# Patient Record
Sex: Female | Born: 1985 | Race: White | Hispanic: No | Marital: Single
Health system: Southern US, Community
[De-identification: ages and names within clinical notes are randomized; demographics above are authoritative.]

---

## 2004-04-12 ENCOUNTER — Emergency Department: Payer: Self-pay | Admitting: Unknown Physician Specialty

## 2004-04-13 ENCOUNTER — Inpatient Hospital Stay: Payer: Self-pay | Admitting: Obstetrics and Gynecology

## 2005-08-08 ENCOUNTER — Emergency Department: Payer: Self-pay | Admitting: Emergency Medicine

## 2005-08-10 ENCOUNTER — Emergency Department: Payer: Self-pay | Admitting: Unknown Physician Specialty

## 2006-01-08 ENCOUNTER — Emergency Department: Payer: Self-pay | Admitting: Emergency Medicine

## 2006-01-09 ENCOUNTER — Ambulatory Visit: Payer: Self-pay | Admitting: Emergency Medicine

## 2006-09-30 ENCOUNTER — Emergency Department (HOSPITAL_COMMUNITY): Admission: EM | Admit: 2006-09-30 | Discharge: 2006-09-30 | Payer: Self-pay | Admitting: Emergency Medicine

## 2007-10-20 ENCOUNTER — Emergency Department: Payer: Self-pay | Admitting: Emergency Medicine

## 2007-12-06 ENCOUNTER — Emergency Department: Payer: Self-pay | Admitting: Emergency Medicine

## 2008-02-12 ENCOUNTER — Emergency Department: Payer: Self-pay | Admitting: Internal Medicine

## 2008-04-06 ENCOUNTER — Emergency Department: Payer: Self-pay | Admitting: Emergency Medicine

## 2008-05-30 ENCOUNTER — Emergency Department: Payer: Self-pay | Admitting: Emergency Medicine

## 2008-06-01 ENCOUNTER — Emergency Department: Payer: Self-pay | Admitting: Emergency Medicine

## 2008-07-04 ENCOUNTER — Emergency Department: Payer: Self-pay | Admitting: Emergency Medicine

## 2008-07-22 ENCOUNTER — Observation Stay: Payer: Self-pay | Admitting: Obstetrics and Gynecology

## 2008-09-05 ENCOUNTER — Observation Stay: Payer: Self-pay | Admitting: Obstetrics & Gynecology

## 2008-09-27 ENCOUNTER — Observation Stay: Payer: Self-pay | Admitting: Obstetrics and Gynecology

## 2008-10-29 ENCOUNTER — Observation Stay: Payer: Self-pay | Admitting: Unknown Physician Specialty

## 2008-11-14 ENCOUNTER — Observation Stay: Payer: Self-pay | Admitting: Obstetrics and Gynecology

## 2008-12-05 ENCOUNTER — Inpatient Hospital Stay: Payer: Self-pay | Admitting: Obstetrics and Gynecology

## 2009-03-25 ENCOUNTER — Emergency Department: Payer: Self-pay | Admitting: Emergency Medicine

## 2009-05-17 ENCOUNTER — Ambulatory Visit: Payer: Self-pay | Admitting: Family Medicine

## 2009-05-28 ENCOUNTER — Emergency Department: Payer: Self-pay | Admitting: Emergency Medicine

## 2009-05-30 ENCOUNTER — Ambulatory Visit: Payer: Self-pay | Admitting: Sports Medicine

## 2009-06-01 ENCOUNTER — Encounter: Payer: Self-pay | Admitting: Family Medicine

## 2009-06-09 ENCOUNTER — Encounter: Payer: Self-pay | Admitting: Family Medicine

## 2009-07-01 ENCOUNTER — Ambulatory Visit: Payer: Self-pay | Admitting: Family Medicine

## 2010-01-20 IMAGING — US US OB US >=[ID] SNGL FETUS
1 series · 17 of 28 positions shown · non-contrast
Comparison: none

REASON FOR EXAM: severe diffuse abd pain
COMMENTS:

[Series 1: us ob us >=(id) sngl fetus · 17 of 57 slices shown]
[im 1/57]
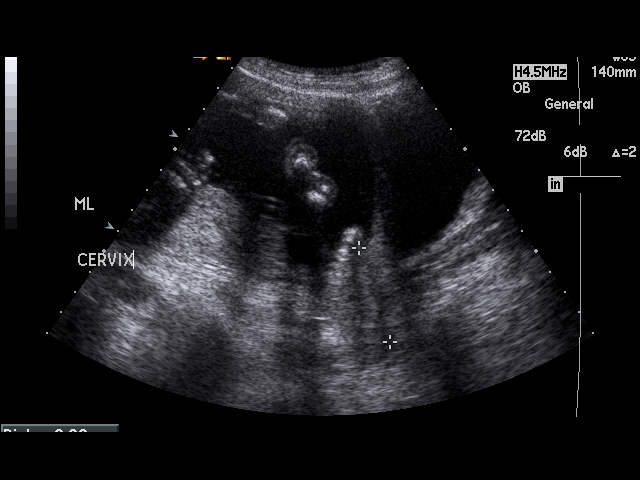
[im 5/57]
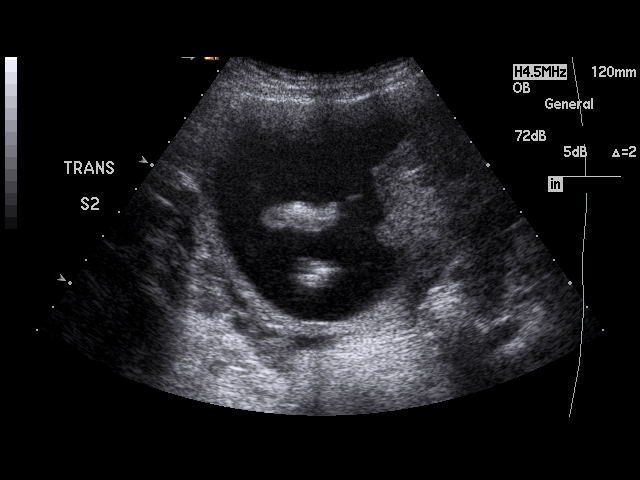
[im 9/57]
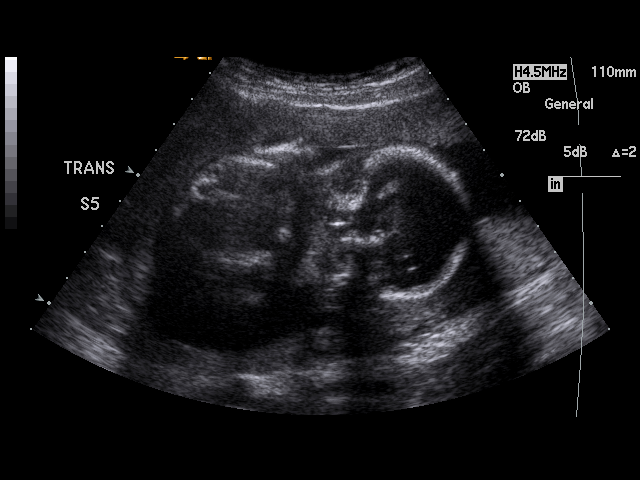
[im 11/57]
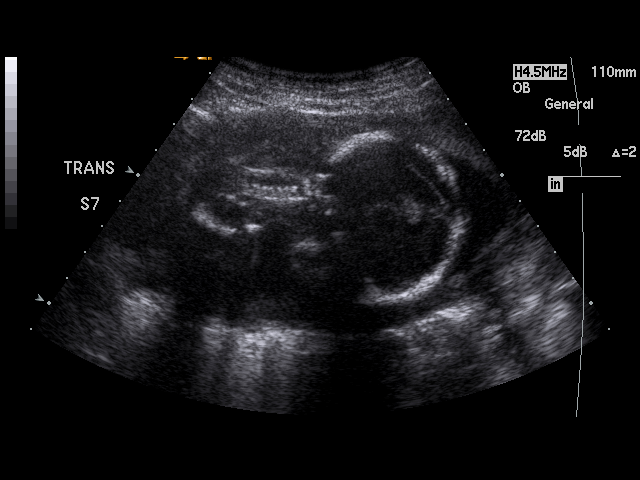
[im 15/57]
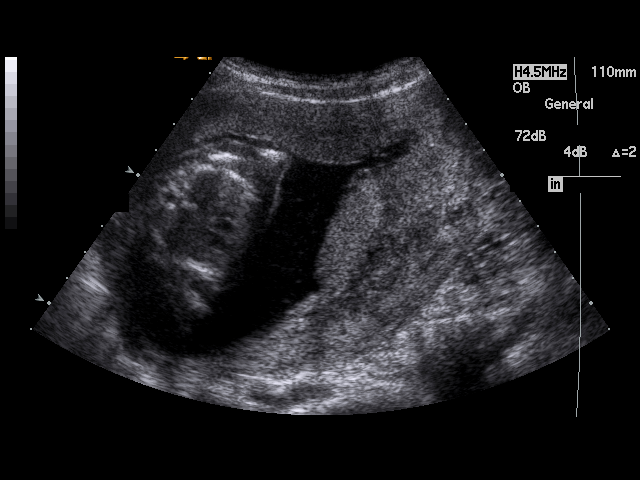
[im 19/57]
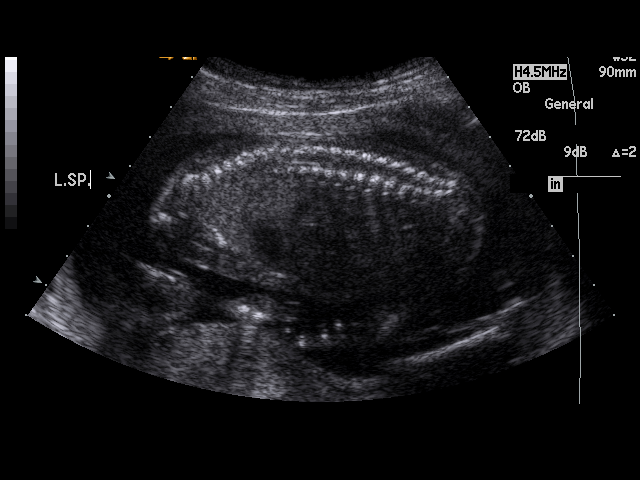
[im 21/57]
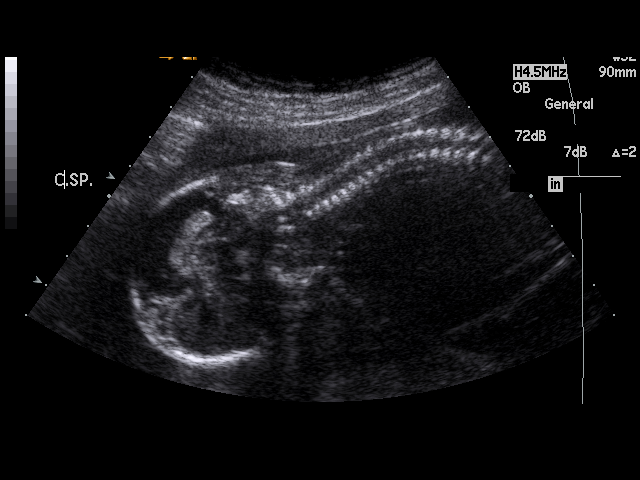
[im 25/57]
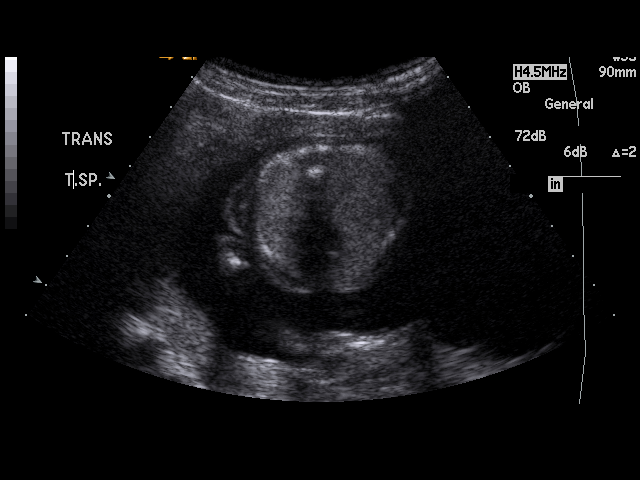
[im 30/57]
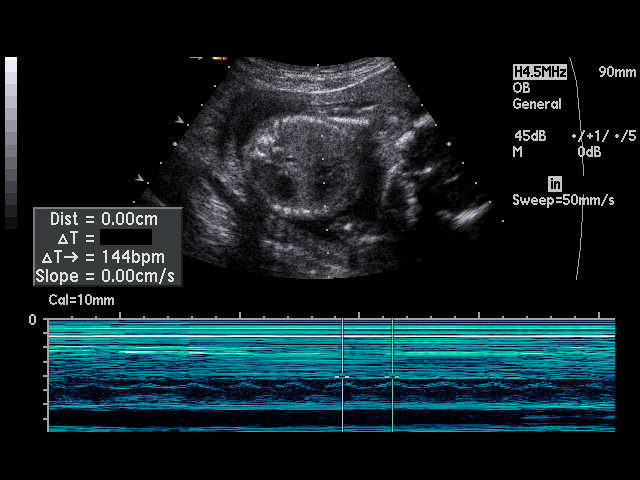
[im 32/57]
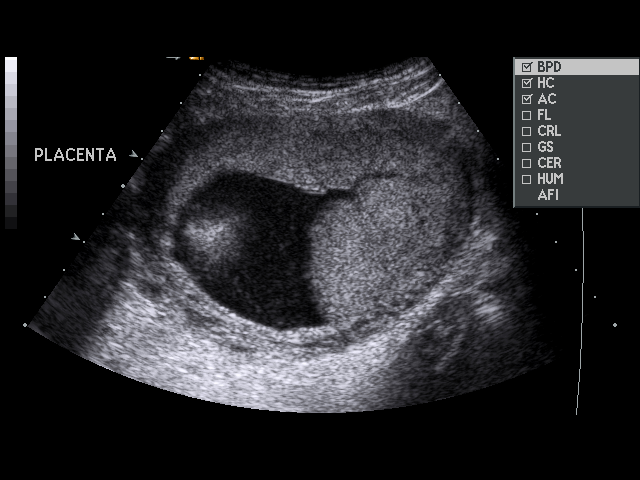
[im 36/57]
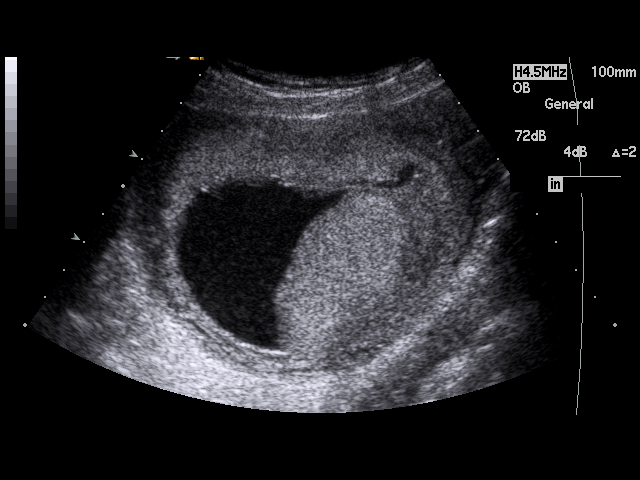
[im 38/57]
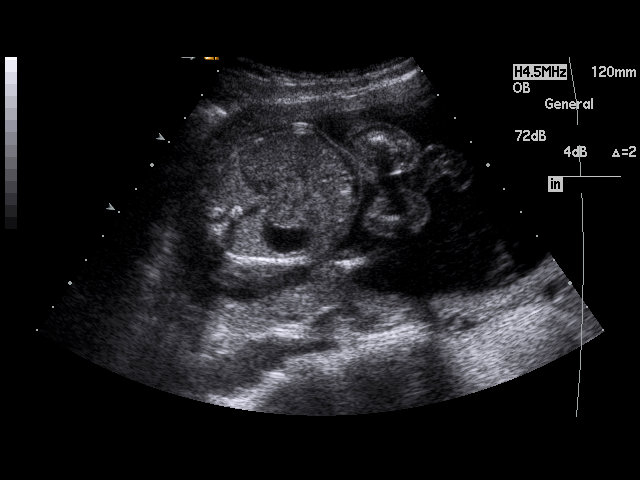
[im 42/57]
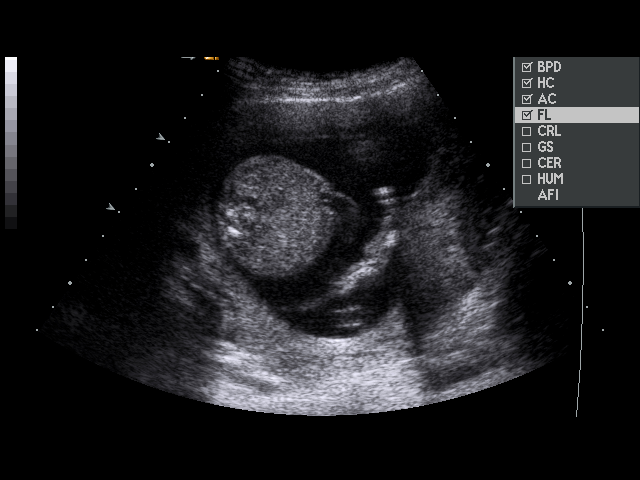
[im 46/57]
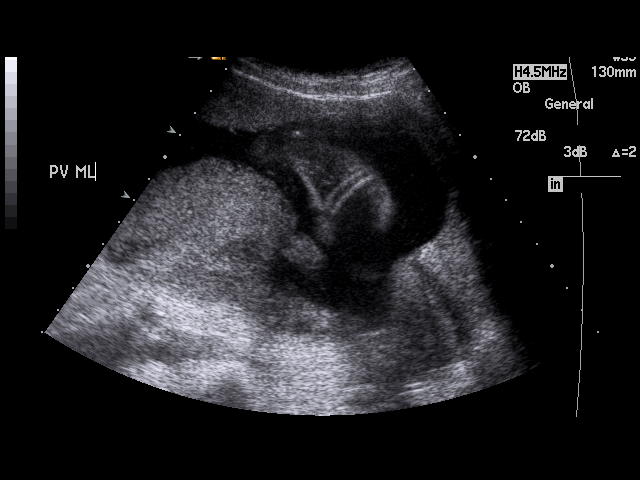
[im 48/57]
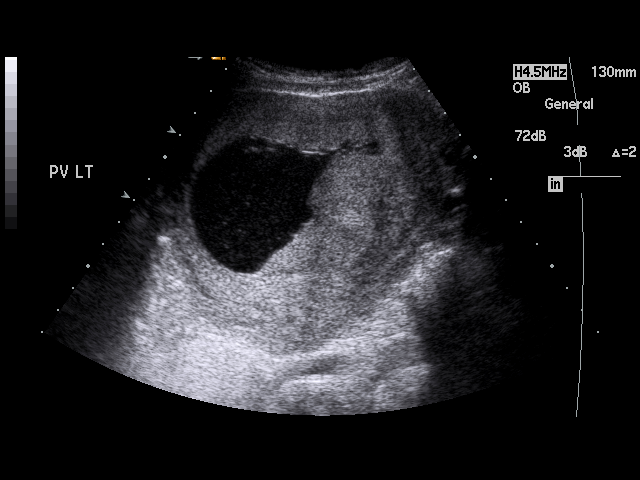
[im 52/57]
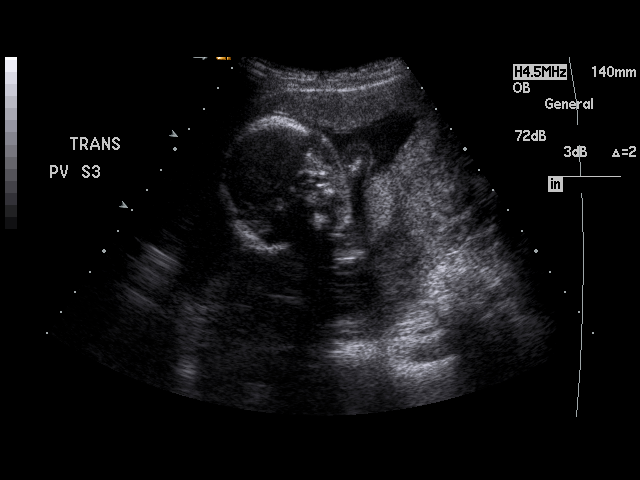
[im 57/57]
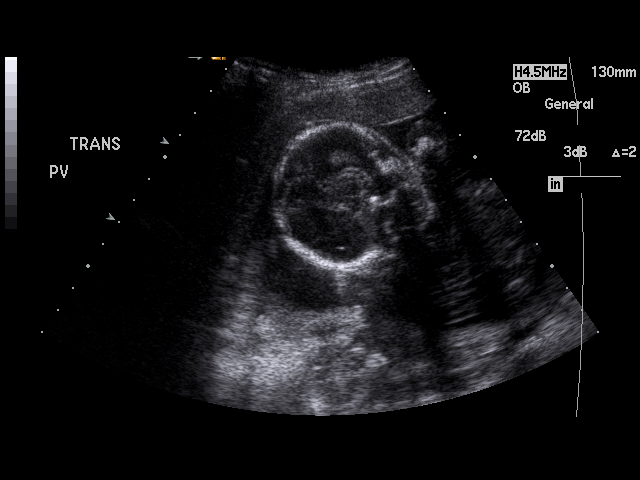

[17 of 28 positions shown; findings below may reference images not displayed]

PROCEDURE:     US  - US OB GREATER/OR EQUAL TO [id number]  - July 22, 2008  [DATE]

RESULT:     A single, viable, live intrauterine pregnancy is appreciated
with mean gestational age by ultrasound of 19 weeks, 4 days. Presentation is
breech. Estimated fetal heart rate is 124 beats per minute. Estimated date
of delivery is 12/11/2008. The cervical length is 4 cm. There is no evidence
of placental abruption. The placenta is anterior and left lateral. There is
no evidence of placenta previa.

Fetal biometry:

     BPD:  4.49 cm, corresponding to EGA of 19 weeks, 4 days
       HC: 16.98 cm, corresponding to EGA of 19 weeks, 4 days
       AC: 14.34 cm, corresponding to EGA of 19 weeks, 5 days
       FL:  13.51 cm, corresponding to EGA of 19 weeks, 6 days

Estimated fetal weight is 333 grams plus or minus 45 grams which corresponds
to 12 ounces, plus or minus 2 ounces.
IMPRESSION: 1.     Single, viable intrauterine pregnancy as described above.
2.     Dr. Buan of the Emergency Department was informed of these findings
via preliminary faxed report on 07/22/2008 at [DATE], [employer]
Time.

## 2010-04-15 ENCOUNTER — Emergency Department: Payer: Self-pay | Admitting: Emergency Medicine

## 2010-10-20 LAB — BASIC METABOLIC PANEL
BUN: 11
Chloride: 107
GFR calc non Af Amer: >60
Potassium: 3.4 — ABNORMAL LOW
Sodium: 144

## 2010-10-20 LAB — ETHANOL: Alcohol, Ethyl (B): 218 — ABNORMAL HIGH

## 2011-10-14 IMAGING — CT CT HEAD WITHOUT CONTRAST
2 series · 16 of 30 positions shown, 20 images · non-contrast
Comparison: none

REASON FOR EXAM: dizziness, headaches
COMMENTS:

PROCEDURE:     CT  - CT HEAD WITHOUT CONTRAST  - April 15, 2010  [DATE]
RESULT:     Comparison:  None
TECHNIQUE: Multiple axial images from the foramen magnum to the vertex were
obtained without IV contrast.

[Series 2: without · axial · non-contrast · 0.39mm/px · z∈[+121,+241]mm · 13 of 29 slices shown, 17 images]
[im 3/29  brain]
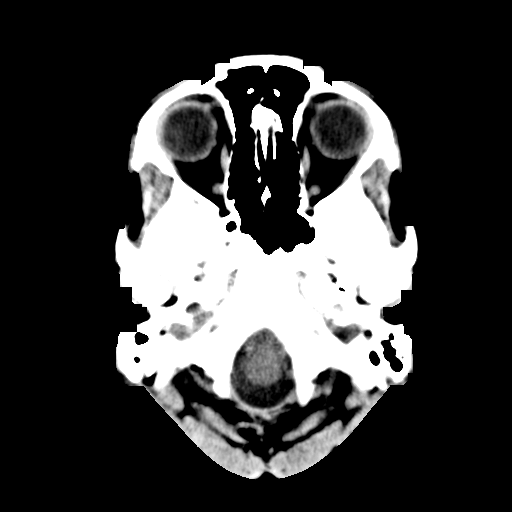
[im 3/29  bone]
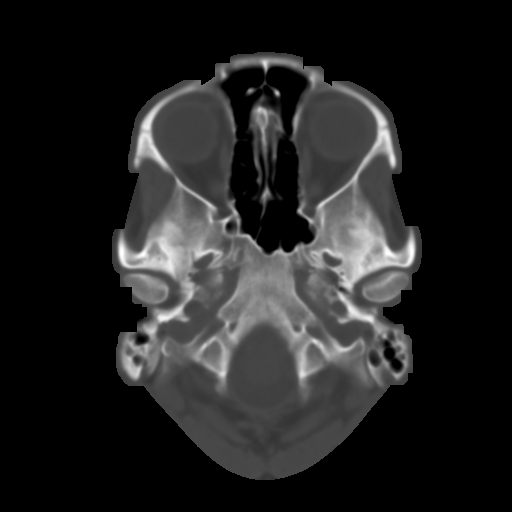
[im 5/29  brain]
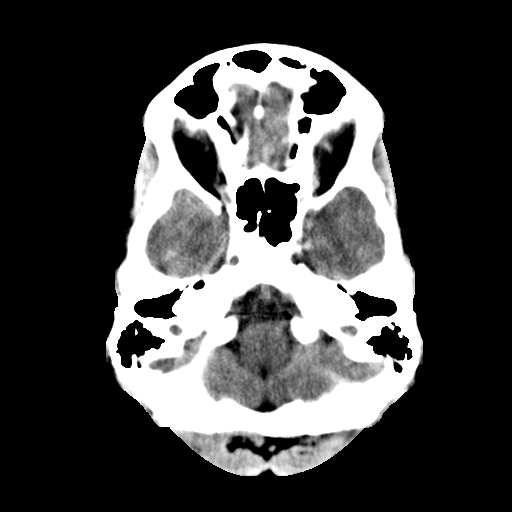
[im 7/29  brain]
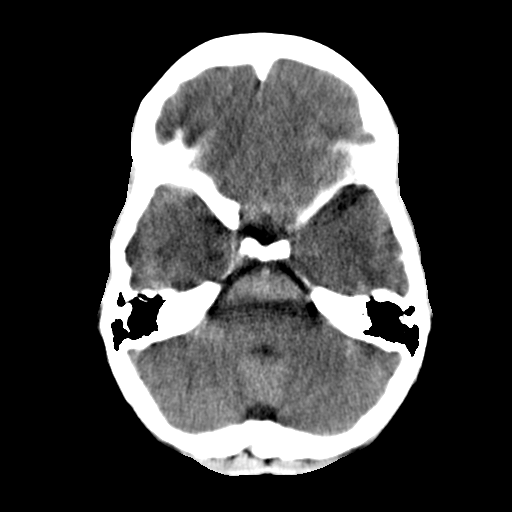
[im 9/29  brain]
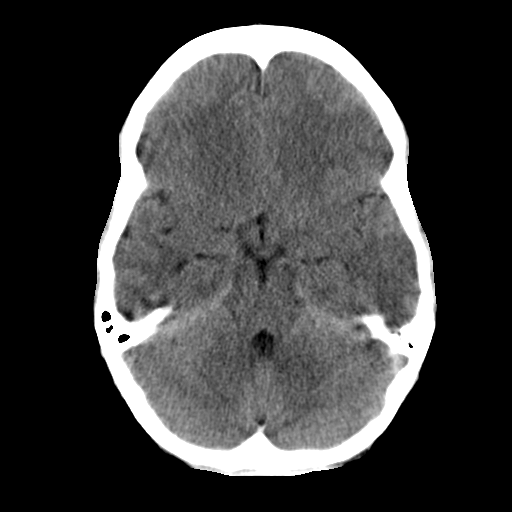
[im 11/29  brain]
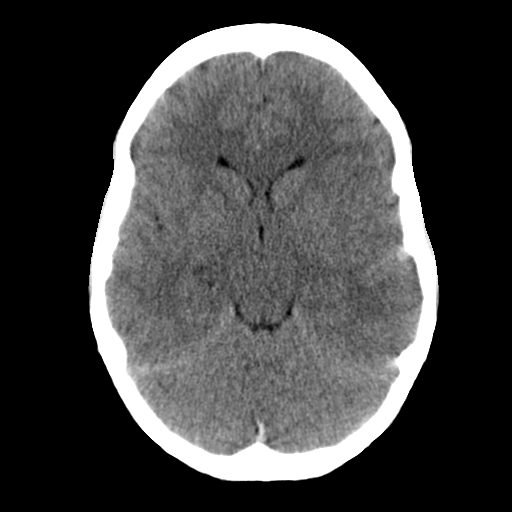
[im 11/29  bone]
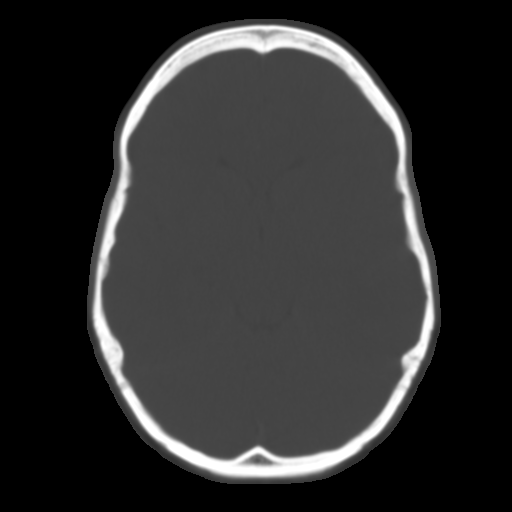
[im 13/29  brain]
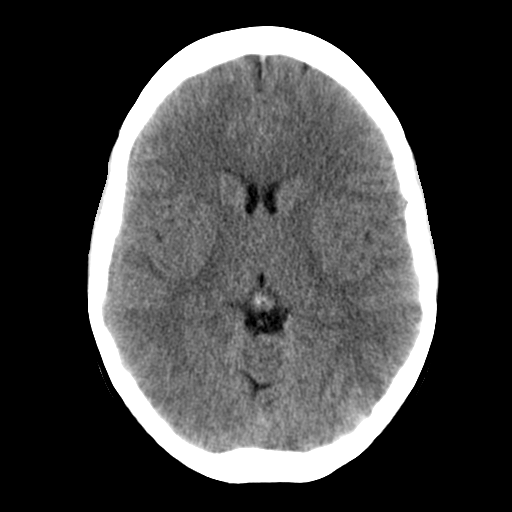
[im 15/29  brain]
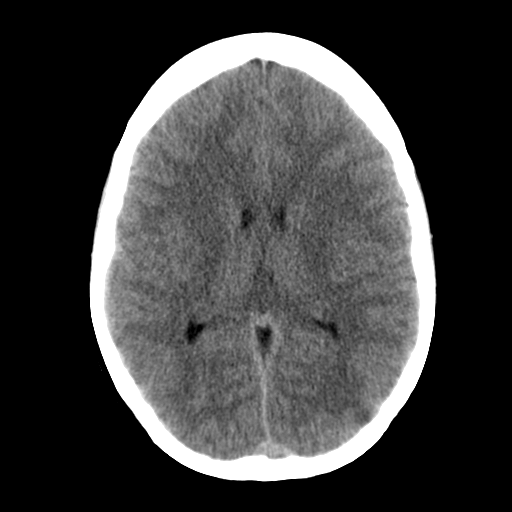
[im 17/29  brain]
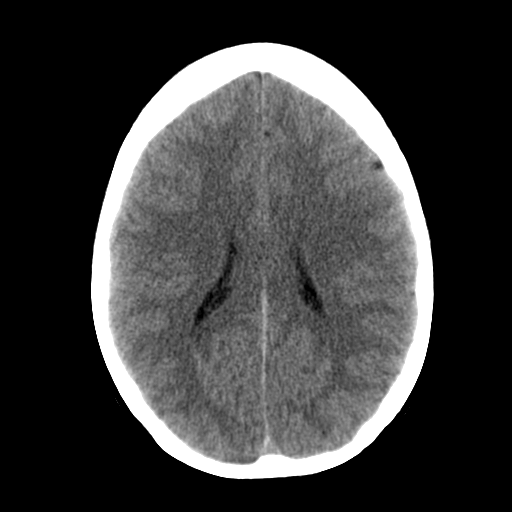
[im 19/29  brain]
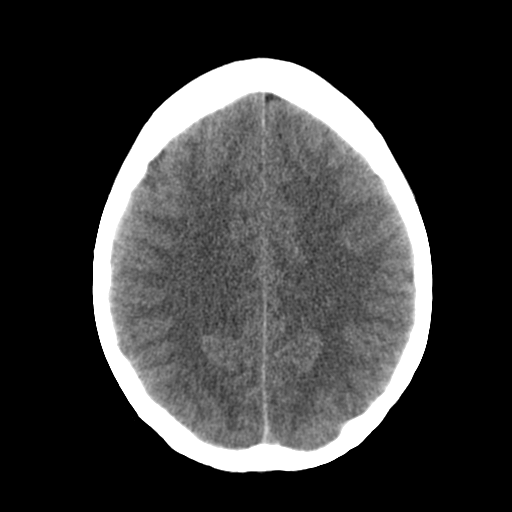
[im 19/29  bone]
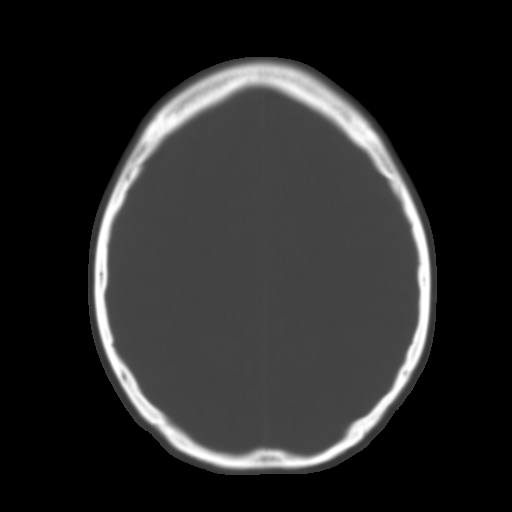
[im 21/29  brain]
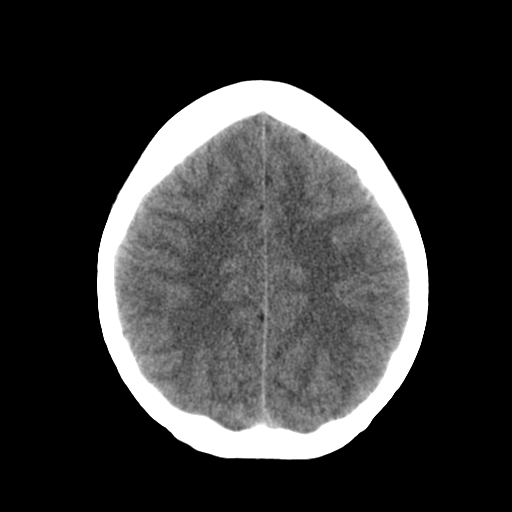
[im 23/29  brain]
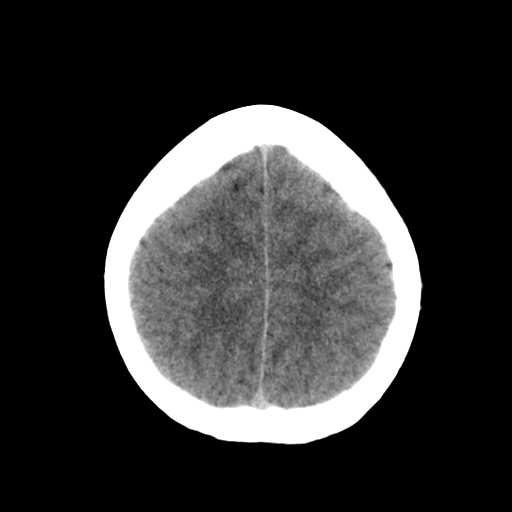
[im 25/29  brain]
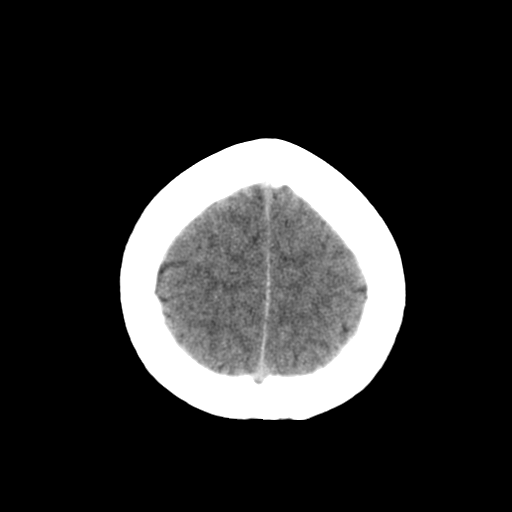
[im 27/29  brain]
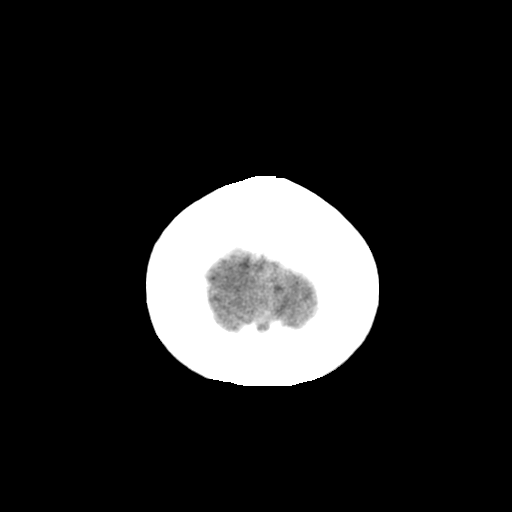
[im 27/29  bone]
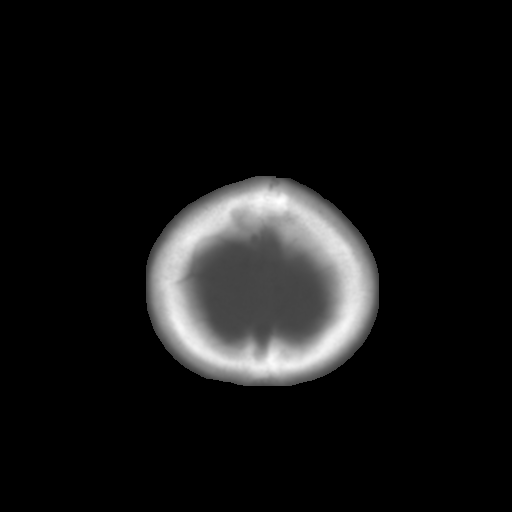

[Series 3: bone · axial · 0.39mm/px · z∈[+121,+161]mm · 3 of 29 slices shown]
[im 3/29  bone]
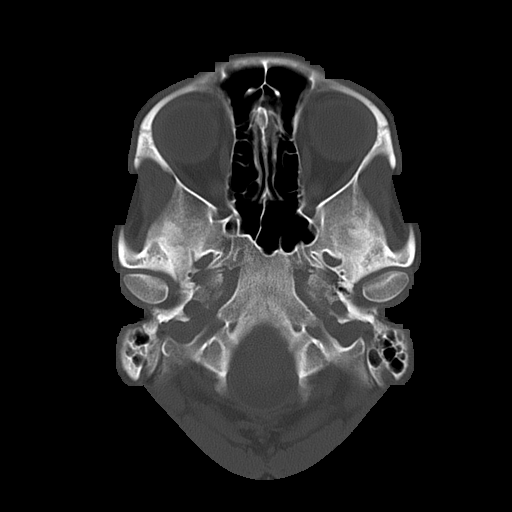
[im 7/29  bone]
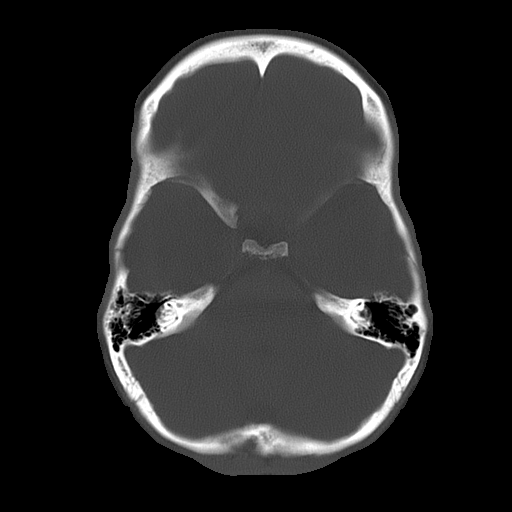
[im 11/29  bone]
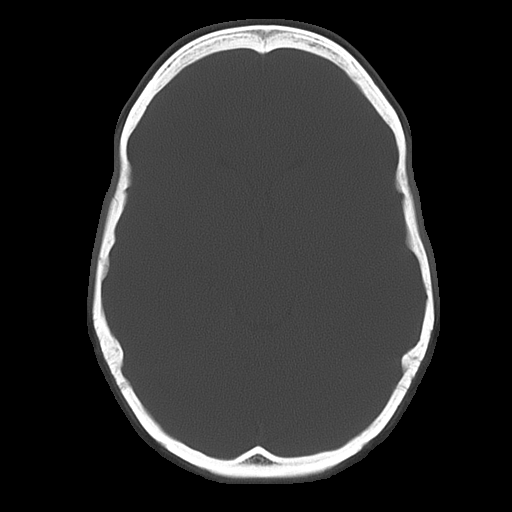

[16 of 30 positions shown; findings below may reference images not displayed]

FINDINGS: There is no evidence for mass effect, midline shift, or extra-axial fluid
collections. There is no evidence for space-occupying lesion, intracranial
hemorrhage, or cortical-based area of infarction.

The osseous structures are unremarkable.
IMPRESSION: No acute intracranial process.

## 2019-03-06 ENCOUNTER — Telehealth: Payer: Self-pay | Admitting: Obstetrics & Gynecology

## 2019-03-06 NOTE — Telephone Encounter (Signed)
Patient is calling to find out information on BLT performed on 12/06/08. Patient is needing to know how much of her fallopian tube was removed. Patient is wanting to schedule for a possible reversal. Please advise

## 2019-03-07 NOTE — Telephone Encounter (Signed)
Spoke w/patient. Paper chart was pulled. Pathology report reviewed and size of each tube portion removed given to patient. Patient would like a copy of this mailed to her. Advised she will need to complete a release of information form. Patient states she did this with the hospital last week and they didn't send her the correct or detailed information. Advised we do not have the ROI form from the hospital, but we do have the report she needs. She will need to complete a ROI either with [place] or at the physcians office she is consulting with and have them fax it to [place]. She will give them the verbal and f/u with [place] if report is still needed.
